# Patient Record
Sex: Male | Born: 2000 | Race: Black or African American | Hispanic: No | Marital: Single | Smoking: Never smoker
Health system: Southern US, Community
[De-identification: ages and names within clinical notes are randomized; demographics above are authoritative.]

## PROBLEM LIST (undated history)

## (undated) DIAGNOSIS — L309 Dermatitis, unspecified: Secondary | ICD-10-CM

## (undated) DIAGNOSIS — J45909 Unspecified asthma, uncomplicated: Secondary | ICD-10-CM

## (undated) HISTORY — DX: Unspecified asthma, uncomplicated: J45.909

## (undated) HISTORY — DX: Dermatitis, unspecified: L30.9

---
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
## 2022-08-15 ENCOUNTER — Other Ambulatory Visit: Payer: Self-pay | Admitting: Primary Care

## 2022-08-15 DIAGNOSIS — J4520 Mild intermittent asthma, uncomplicated: Secondary | ICD-10-CM

## 2022-09-07 ENCOUNTER — Other Ambulatory Visit: Payer: Self-pay | Admitting: Primary Care

## 2022-09-07 DIAGNOSIS — J4520 Mild intermittent asthma, uncomplicated: Secondary | ICD-10-CM

## 2022-12-28 ENCOUNTER — Other Ambulatory Visit: Payer: Self-pay | Admitting: Primary Care

## 2022-12-28 ENCOUNTER — Telehealth: Payer: Self-pay | Admitting: Primary Care

## 2022-12-28 DIAGNOSIS — J4520 Mild intermittent asthma, uncomplicated: Secondary | ICD-10-CM

## 2022-12-28 MED ORDER — QVAR REDIHALER 80 MCG/ACT IN AERB: INHALATION_SPRAY | RESPIRATORY_TRACT | 0 refills | Status: DC

## 2022-12-28 MED ORDER — ALBUTEROL SULFATE HFA 108 (90 BASE) MCG/ACT IN AERS: INHALATION_SPRAY | RESPIRATORY_TRACT | 0 refills | Status: DC

## 2022-12-28 NOTE — Telephone Encounter (Signed)
Patient is overdue for CPE/follow up, this will be required prior to any further refills.  Please schedule, thank you!   

## 2022-12-28 NOTE — Telephone Encounter (Signed)
Spoke to pt's mother, scheduled cpe for 01/05/23

## 2022-12-28 NOTE — Telephone Encounter (Signed)
Patient mother called and asked if the medication  beclomethasone (QVAR REDIHALER) 80 MCG/ACT inhaler,  albuterol (VENTOLIN HFA) 108 (90 Base) MCG/ACT inhaler    be sent in to [facility] in [place]. [address].

## 2022-12-28 NOTE — Telephone Encounter (Signed)
Noted, Rx's sent to pharmacy 

## 2023-01-05 ENCOUNTER — Encounter: Payer: Self-pay | Admitting: Primary Care

## 2023-01-05 ENCOUNTER — Ambulatory Visit (INDEPENDENT_AMBULATORY_CARE_PROVIDER_SITE_OTHER): Payer: BC Managed Care – PPO | Admitting: Primary Care

## 2023-01-05 VITALS — BP 152/86 | HR 70 | Temp 97.7°F | Ht 68.6 in | Wt 257.0 lb

## 2023-01-05 DIAGNOSIS — Z0000 Encounter for general adult medical examination without abnormal findings: Secondary | ICD-10-CM | POA: Diagnosis not present

## 2023-01-05 DIAGNOSIS — L309 Dermatitis, unspecified: Secondary | ICD-10-CM

## 2023-01-05 DIAGNOSIS — T7805XS Anaphylactic reaction due to tree nuts and seeds, sequela: Secondary | ICD-10-CM

## 2023-01-05 DIAGNOSIS — T7805XA Anaphylactic reaction due to tree nuts and seeds, initial encounter: Secondary | ICD-10-CM | POA: Insufficient documentation

## 2023-01-05 DIAGNOSIS — R030 Elevated blood-pressure reading, without diagnosis of hypertension: Secondary | ICD-10-CM | POA: Diagnosis not present

## 2023-01-05 DIAGNOSIS — Z1159 Encounter for screening for other viral diseases: Secondary | ICD-10-CM

## 2023-01-05 DIAGNOSIS — J4520 Mild intermittent asthma, uncomplicated: Secondary | ICD-10-CM

## 2023-01-05 LAB — COMPREHENSIVE METABOLIC PANEL
ALT: 15 U/L (ref 0–53)
AST: 16 U/L (ref 0–37)
Albumin: 4.7 g/dL (ref 3.5–5.2)
Alkaline Phosphatase: 76 U/L (ref 52–171)
BUN: 11 mg/dL (ref 6–23)
CO2: 27 mEq/L (ref 19–32)
Calcium: 9.9 mg/dL (ref 8.4–10.5)
Chloride: 105 mEq/L (ref 96–112)
Creatinine, Ser: 0.99 mg/dL (ref 0.40–1.50)
GFR: 110.36 mL/min (ref >60.00)
Glucose, Bld: 88 mg/dL (ref 70–99)
Potassium: 4.1 mEq/L (ref 3.5–5.1)
Sodium: 139 mEq/L (ref 135–145)
Total Bilirubin: 0.4 mg/dL (ref 0.2–1.2)
Total Protein: 8.2 g/dL (ref 6.0–8.3)

## 2023-01-05 LAB — CBC
HCT: 45.5 % (ref 36.0–49.0)
Hemoglobin: 15.4 g/dL (ref 12.0–16.0)
MCHC: 33.9 g/dL (ref 31.0–37.0)
MCV: 91.3 fl (ref 78.0–98.0)
Platelets: 196 10*3/uL (ref 150.0–575.0)
RBC: 4.99 Mil/uL (ref 3.80–5.70)
RDW: 13 % (ref 11.4–15.5)
WBC: 4.7 10*3/uL (ref 4.5–13.5)

## 2023-01-05 LAB — LIPID PANEL
Cholesterol: 146 mg/dL (ref 0–200)
HDL: 49.8 mg/dL (ref >39.00)
LDL Cholesterol: 82 mg/dL (ref 0–99)
NonHDL: 96.68
Total CHOL/HDL Ratio: 3
Triglycerides: 72 mg/dL (ref 0.0–149.0)
VLDL: 14.4 mg/dL (ref 0.0–40.0)

## 2023-01-05 LAB — HEMOGLOBIN A1C: Hgb A1c MFr Bld: 5.2 % (ref 4.6–6.5)

## 2023-01-05 MED ORDER — EPINEPHRINE 0.3 MG/0.3ML IJ SOAJ: 0.3000 mg | INTRAMUSCULAR | 0 refills | Status: DC | PRN

## 2023-01-05 NOTE — Assessment & Plan Note (Signed)
Refill provided for EpiPen. No recent use.

## 2023-01-05 NOTE — Assessment & Plan Note (Signed)
Above goal today, also on recheck.  Recommended he start checking BP at home, instructions provided. Discussed to notify me if readings are consistently at or above 140/90.  Labs pending.

## 2023-01-05 NOTE — Patient Instructions (Signed)
Your blood pressure is too high today.  Start monitoring your blood pressure daily, around the same time of day, for the next 2-3 weeks.  Ensure that you have rested for 30 minutes prior to checking your blood pressure.   Record your readings and notify me if you see numbers consistently at or above 140 on top and/or 90 on bottom.  Stop by the lab prior to leaving today. I will notify you of your results once received.   It was a pleasure to see you today!

## 2023-01-05 NOTE — Assessment & Plan Note (Signed)
Controlled.  Continue Triamcinolone 0.5% ointment PRN.

## 2023-01-05 NOTE — Progress Notes (Addendum)
Subjective:    Patient ID: Fernando Gutierrez, male    DOB: 05/16/2001, 22 y.o.   MRN: [id number]  HPI  Fernando Gutierrez is a very pleasant 22 y.o. male who presents today for complete physical and follow up of chronic conditions.  Immunizations: -Tetanus: Completed in 2016 -Influenza: Declines  -HPV: Completed series  Diet: Fair diet.  Exercise: No regular exercise.  Eye exam: Completes annually  Dental exam: Completes semi-annually    BP Readings from Last 3 Encounters:  01/05/23 (!) 152/86  12/08/21 120/82  06/29/20 120/82 (63 %, Z = 0.33 /  93 %, Z = 1.48)*   *BP percentiles are based on the 2017 AAP Clinical Practice Guideline for boys         Review of Systems  Constitutional:  Negative for unexpected weight change.  HENT:  Negative for rhinorrhea.   Respiratory:  Negative for cough and shortness of breath.   Cardiovascular:  Negative for chest pain.  Gastrointestinal:  Negative for constipation and diarrhea.  Genitourinary:  Negative for difficulty urinating.  Musculoskeletal:  Negative for arthralgias and myalgias.  Skin:  Negative for rash.  Allergic/Immunologic: Negative for environmental allergies.  Neurological:  Negative for dizziness, numbness and headaches.  Psychiatric/Behavioral:  The patient is not nervous/anxious.          Past Medical History:  Diagnosis Date   Asthma    Dysuria 06/17/2019   Eczema     Social History   Socioeconomic History   Marital status: Single    Spouse name: Not on file   Number of children: Not on file   Years of education: Not on file   Highest education level: Not on file  Occupational History   Not on file  Tobacco Use   Smoking status: Never   Smokeless tobacco: Never  Substance and Sexual Activity   Alcohol use: No   Drug use: Not on file   Sexual activity: Not on file  Other Topics Concern   Not on file  Social History Narrative   Rising 8th grader.   Favorite subject science.   Plays  football for school.   Enjoys video games   Social Determinants of Health   Financial Resource Strain: Not on file  Food Insecurity: Not on file  Transportation Needs: Not on file  Physical Activity: Not on file  Stress: Not on file  Social Connections: Not on file  Intimate Partner Violence: Not on file    History reviewed. No pertinent surgical history.  Family History  Problem Relation Age of Onset   Hypertension Mother    Hypertension Father     Allergies  Allergen Reactions   Eggs Or Egg-Derived Products    Other Other (See Comments)    ALL NUTS    Current Outpatient Medications on File Prior to Visit  Medication Sig Dispense Refill   albuterol (VENTOLIN HFA) 108 (90 Base) MCG/ACT inhaler INHALE 1 TO 2 PUFFS BY MOUTH EVERY 6 HOURS AS NEEDED 6.7 g 0   beclomethasone (QVAR REDIHALER) 80 MCG/ACT inhaler INHALE 2 PUFFS INTO THE LUNGS EVERY DAY 10.6 g 0   cetirizine (ZYRTEC) 10 MG tablet Take 10 mg by mouth daily.     montelukast (SINGULAIR) 5 MG chewable tablet Chew 1 tablet (5 mg total) by mouth at bedtime. For asthma and allergies. 90 tablet 3   triamcinolone ointment (KENALOG) 0.5 % APPLY TO AFFECTED AREA TWICE A DAY 30 g 0   No current facility-administered medications on  file prior to visit.    BP (!) 152/86   Pulse 70   Temp 97.7 F (36.5 C) (Temporal)   Ht 5' 8.6" (1.742 m)   Wt 257 lb (116.6 kg)   SpO2 100%   BMI 38.40 kg/m  Objective:   Physical Exam HENT:     Right Ear: Tympanic membrane and ear canal normal.     Left Ear: Tympanic membrane and ear canal normal.     Nose: Nose normal.     Right Sinus: No maxillary sinus tenderness or frontal sinus tenderness.     Left Sinus: No maxillary sinus tenderness or frontal sinus tenderness.  Eyes:     Conjunctiva/sclera: Conjunctivae normal.  Neck:     Thyroid: No thyromegaly.     Vascular: No carotid bruit.  Cardiovascular:     Rate and Rhythm: Normal rate and regular rhythm.     Heart sounds:  Normal heart sounds.  Pulmonary:     Effort: Pulmonary effort is normal.     Breath sounds: Normal breath sounds. No wheezing or rales.  Abdominal:     General: Bowel sounds are normal.     Palpations: Abdomen is soft.     Tenderness: There is no abdominal tenderness.  Musculoskeletal:        General: Normal range of motion.     Cervical back: Neck supple.  Skin:    General: Skin is warm and dry.  Neurological:     Mental Status: He is alert and oriented to person, place, and time.     Cranial Nerves: No cranial nerve deficit.     Deep Tendon Reflexes: Reflexes are normal and symmetric.  Psychiatric:        Mood and Affect: Mood normal.           Assessment & Plan:  Preventative health care Assessment & Plan: Immunizations UTD. Declines influenza vaccine.  Discussed the importance of a healthy diet and regular exercise in order for weight loss, and to reduce the risk of further co-morbidity.  Exam stable. Labs pending.  Follow up in 1 year for repeat physical.   Orders: -     Lipid panel -     Hemoglobin A1c -     Comprehensive metabolic panel -     CBC  Mild intermittent asthma without complication Assessment & Plan: Controlled.  Continue Singulair 5 mg daily. Continue Qvar 80 mcg 2 puffs daily.  Continue albuterol inhaler PRN, uses sparingly.   Allergy testing panel ordered per patient request.   Orders: -     Food Allergy Profile -     Allergy Panel, Region 2, Grasses -     Allergy Panel 11, Mold Group  Encounter for hepatitis C screening test for low risk patient -     Hepatitis C antibody  Elevated blood pressure reading in office with white coat syndrome, without diagnosis of hypertension Assessment & Plan: Above goal today, also on recheck.  Recommended he start checking BP at home, instructions provided. Discussed to notify me if readings are consistently at or above 140/90.  Labs pending.   Eczema, unspecified type Assessment &  Plan: Controlled.  Continue Triamcinolone 0.5% ointment PRN.   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 No recent use.  Orders: -     EPINEPHrine; Inject 0.3 mg into the muscle as needed for anaphylaxis.  Dispense: 1 each; Refill: 0  Pleas Koch, NP

## 2023-01-05 NOTE — Addendum Note (Signed)
Addended by: Pleas Koch on: 01/05/2023 12:16 PM   Modules accepted: Orders

## 2023-01-05 NOTE — Assessment & Plan Note (Signed)
Immunizations UTD. Declines influenza vaccine.   Discussed the importance of a healthy diet and regular exercise in order for weight loss, and to reduce the risk of further co-morbidity.  Exam stable. Labs pending.  Follow up in 1 year for repeat physical.  

## 2023-01-05 NOTE — Assessment & Plan Note (Addendum)
Controlled.  Continue Singulair 5 mg daily. Continue Qvar 80 mcg 2 puffs daily.  Continue albuterol inhaler PRN, uses sparingly.   Allergy testing panel ordered per patient request.

## 2023-01-08 LAB — ALLERGY PANEL, REGION 2, GRASSES
CLASS: 3
CLASS: 4
CLASS: 4
Class: 3
Class: 3
G005 Rye, Perennial: 17.9 kU/L — ABNORMAL HIGH
G009 Red Top: 24.9 kU/L — ABNORMAL HIGH
Johnson Grass: 6.04 kU/L — ABNORMAL HIGH
ORCHARD GRASS (COCKSFOOT) (G3) IGE: 16.3 kU/L — ABNORMAL HIGH
Timothy Grass: 17.3 kU/L — ABNORMAL HIGH

## 2023-01-08 LAB — ALLERGY PANEL 11, MOLD GROUP
Allergen, A. alternata, m6: 3.05 kU/L — ABNORMAL HIGH
Allergen, Mucor Racemosus, M4: 0.58 kU/L — ABNORMAL HIGH
Aspergillus fumigatus, m3: 2.19 kU/L — ABNORMAL HIGH
CLADOSPORIUM HERBARUM (M2) IGE: 6.13 kU/L — ABNORMAL HIGH
CLASS: 1
CLASS: 2
Candida Albicans: 1.06 kU/L — ABNORMAL HIGH
Class: 2
Class: 2
Class: 3

## 2023-01-08 LAB — FOOD ALLERGY PROFILE
Allergen, Salmon, f41: <0.1 kU/L
Almonds: 3.28 kU/L — ABNORMAL HIGH
CLASS: 0
CLASS: 1
CLASS: 2
CLASS: 3
CLASS: 3
CLASS: 3
CLASS: 3
CLASS: 3
CLASS: 4
Cashew IgE: 7.28 kU/L — ABNORMAL HIGH
Class: 3
Class: 3
Egg White IgE: 10.1 kU/L — ABNORMAL HIGH
Fish Cod: 0.19 kU/L — ABNORMAL HIGH
Hazelnut: 25.5 kU/L — ABNORMAL HIGH
Milk IgE: 0.26 kU/L — ABNORMAL HIGH
Peanut IgE: 16.1 kU/L — ABNORMAL HIGH
Scallop IgE: 0.13 kU/L — ABNORMAL HIGH
Sesame Seed f10: 7.44 kU/L — ABNORMAL HIGH
Shrimp IgE: 0.25 kU/L — ABNORMAL HIGH
Soybean IgE: 6.25 kU/L — ABNORMAL HIGH
Tuna IgE: 0.38 kU/L — ABNORMAL HIGH
Walnut: 12.5 kU/L — ABNORMAL HIGH
Wheat IgE: 6.81 kU/L — ABNORMAL HIGH

## 2023-01-08 LAB — INTERPRETATION:

## 2023-01-08 LAB — HEPATITIS C ANTIBODY: Hepatitis C Ab: NONREACTIVE

## 2023-01-09 ENCOUNTER — Telehealth: Payer: Self-pay | Admitting: Primary Care

## 2023-01-09 ENCOUNTER — Other Ambulatory Visit: Payer: Self-pay | Admitting: Primary Care

## 2023-01-09 DIAGNOSIS — Z9109 Other allergy status, other than to drugs and biological substances: Secondary | ICD-10-CM

## 2023-01-09 DIAGNOSIS — Z91018 Allergy to other foods: Secondary | ICD-10-CM

## 2023-01-09 NOTE — Telephone Encounter (Signed)
Patient mom Fernando Gutierrez called in returning a call she received. Thank you!

## 2023-01-10 NOTE — Telephone Encounter (Signed)
See result note for further documentation.

## 2023-01-20 ENCOUNTER — Other Ambulatory Visit: Payer: Self-pay | Admitting: Primary Care

## 2023-01-20 DIAGNOSIS — J4520 Mild intermittent asthma, uncomplicated: Secondary | ICD-10-CM

## 2023-02-20 ENCOUNTER — Encounter: Payer: Self-pay | Admitting: Internal Medicine

## 2023-02-20 ENCOUNTER — Ambulatory Visit (INDEPENDENT_AMBULATORY_CARE_PROVIDER_SITE_OTHER): Payer: BC Managed Care – PPO | Admitting: Internal Medicine

## 2023-02-20 ENCOUNTER — Other Ambulatory Visit: Payer: Self-pay

## 2023-02-20 VITALS — BP 140/62 | HR 99 | Temp 98.6°F | Resp 16 | Ht 69.29 in | Wt 248.6 lb

## 2023-02-20 DIAGNOSIS — J302 Other seasonal allergic rhinitis: Secondary | ICD-10-CM | POA: Diagnosis not present

## 2023-02-20 DIAGNOSIS — J4530 Mild persistent asthma, uncomplicated: Secondary | ICD-10-CM

## 2023-02-20 DIAGNOSIS — L2084 Intrinsic (allergic) eczema: Secondary | ICD-10-CM

## 2023-02-20 DIAGNOSIS — T7800XA Anaphylactic reaction due to unspecified food, initial encounter: Secondary | ICD-10-CM

## 2023-02-20 DIAGNOSIS — J3089 Other allergic rhinitis: Secondary | ICD-10-CM

## 2023-02-20 MED ORDER — ALBUTEROL SULFATE HFA 108 (90 BASE) MCG/ACT IN AERS: INHALATION_SPRAY | RESPIRATORY_TRACT | 1 refills | Status: DC

## 2023-02-20 MED ORDER — EPINEPHRINE 0.3 MG/0.3ML IJ SOAJ: 0.3000 mg | INTRAMUSCULAR | 1 refills | Status: DC | PRN

## 2023-02-20 MED ORDER — MONTELUKAST SODIUM 10 MG PO TABS: 10.0000 mg | ORAL_TABLET | Freq: Every day | ORAL | 5 refills | Status: DC

## 2023-02-20 MED ORDER — AZELASTINE HCL 0.1 % NA SOLN: 1.0000 | Freq: Two times a day (BID) | NASAL | 5 refills | Status: DC | PRN

## 2023-02-20 MED ORDER — CETIRIZINE HCL 10 MG PO TABS: 10.0000 mg | ORAL_TABLET | Freq: Every day | ORAL | 5 refills | Status: DC

## 2023-02-20 MED ORDER — QVAR REDIHALER 80 MCG/ACT IN AERB: 2.0000 | INHALATION_SPRAY | Freq: Two times a day (BID) | RESPIRATORY_TRACT | 5 refills | Status: DC

## 2023-02-20 MED ORDER — FLUTICASONE PROPIONATE 50 MCG/ACT NA SUSP: 2.0000 | Freq: Every day | NASAL | 5 refills | Status: DC

## 2023-02-20 NOTE — Progress Notes (Signed)
NEW PATIENT  Date of Service/Encounter:  02/20/23  Consult requested by: Pleas Koch, NP   Subjective:   Fernando Gutierrez (DOB: 27-Nov-2001) is a 22 y.o. male who presents to the clinic on 02/20/2023 with a chief complaint of Establish Care (Pt would like to discuss asthma control), Eczema, and Wheezing .    History obtained from: chart review and patient.   Asthma:  Diagnosed in childhood. It has improved since he got older.   When he forgets to take the Singulair/Qvar, he does have trouble with SOB/wheezing.  Also has noticed some symptoms with exercise.  Noticed that his asthma flare ups when he wakes up in the morning or soon as he lays down with wheezing but when I asked him further questions, I am not sure if this is wheezing or uncontrolled upper airway symptoms.  This usually resolves in a few minutes once he moves around.   Few times a week daytime symptoms in past month, none nighttime awakenings in past month Using rescue inhaler: 2-3x/week Limitations to daily activity: none 0 ED visits/UC visits and 0 oral steroids in the past year 0 number of lifetime hospitalizations, 0 number of lifetime intubations.  Identified Triggers: exercise Prior PFTs or spirometry: none Current regimen:  Maintenance: Singulair, Qvar 43mcg 2 puffs BID Rescue: Albuterol 2 puffs q4-6 hrs PRN  Rhinitis:  Started since childhood.  Symptoms include: nasal congestion, rhinorrhea, post nasal drainage, and sneezing  Occurs seasonally-with Spring/Fall Potential triggers: not sure Treatments tried:  Zyrtec PRN; last use was   Previous allergy testing: yes; limited blood testing positive for grass/mold History of reflux/heartburn: rarely and only if he eats spicy/acidic foods. History of sinus surgery: no Nonallergic triggers: none   Atopic Dermatitis:  Diagnosed in childhood.   Areas that flare commonly are antecubital fossa. Current regimen: triamcinolone rarely, aveeno lotion     Reports use of fragrance/dye free products Identified triggers of flares include sweating Sleep is not affected  Concern for Food Allergy:  Foods of concern: peanuts and treenuts, fresh eggs  History of reaction:  Had GI symptoms with chik fila and was thought it was due to peanuts?  Reports just getting really sick to the stomach, no other symptoms. Not sure what happened with eggs   Previous allergy testing yes recently bloodwork was positive for eggs, peanuts, treenuts.  Carries an epinephrine autoinjector: yes  He has no interest in reintroduction of nuts but would consider it for eggs and will let us know.   Past Medical History: Past Medical History:  Diagnosis Date   Asthma    Dysuria 06/17/2019   Eczema     Past Surgical History: History reviewed. No pertinent surgical history.  Family History: Family History  Problem Relation Age of Onset   Hypertension Mother    Hypertension Father     Social History:  Lives in a 10 year house Flooring in bedroom: carpet Pets: none Tobacco use/exposure: none Job: in college  Medication List:  Allergies as of 02/20/2023       Reactions   Egg-derived Products    Other Other (See Comments)   ALL NUTS        Medication List        Accurate as of February 20, 2023  3:28 PM. If you have any questions, ask your nurse or doctor.          STOP taking these medications    montelukast 5 MG chewable tablet Commonly known as: SINGULAIR  Stopped by: Larose Kells, MD       TAKE these medications    albuterol 108 (90 Base) MCG/ACT inhaler Commonly known as: VENTOLIN HFA INHALE 1 TO 2 PUFFS BY MOUTH EVERY 6 HOURS AS NEEDED   cetirizine 10 MG tablet Commonly known as: ZYRTEC Take 10 mg by mouth daily.   EPINEPHrine 0.3 mg/0.3 mL Soaj injection Commonly known as: EPI-PEN Inject 0.3 mg into the muscle as needed for anaphylaxis.   Qvar RediHaler 80 MCG/ACT inhaler Generic drug: beclomethasone INHALE 2 PUFFS INTO  THE LUNGS EVERY DAY   triamcinolone ointment 0.5 % Commonly known as: KENALOG APPLY TO AFFECTED AREA TWICE A DAY         REVIEW OF SYSTEMS: Pertinent positives and negatives discussed in HPI.   Objective:   Physical Exam: BP (!) 140/62   Pulse 99   Temp 98.6 F (37 C)   Resp 16   Ht 5' 9.29" (1.76 m)   Wt 248 lb 9.6 oz (112.8 kg)   SpO2 100%   BMI 36.40 kg/m  Body mass index is 36.4 kg/m. GEN: alert, well developed HEENT: clear conjunctiva, TM grey and translucent, nose with + inferior turbinate hypertrophy, pale nasal mucosa, slight clear rhinorrhea, + cobblestoning HEART: regular rate and rhythm, no murmur LUNGS: clear to auscultation bilaterally, no coughing, unlabored respiration ABDOMEN: soft, non distended  SKIN: no rashes or lesions  Reviewed:  01/05/2023: seen by Carlis Abbott NP for controlled mild intermittent asthma on Singulair, Qvar, Albuterol PRN and eczema (triamcinolone) and treenut anaphylaxis (epipen).  07/31/2020: seen for HA, fever, chills, cough, SOB.  No wheezing on exam. Obtained COVID test due to exposure history. Rapid COVID was negative.    06/29/2020: seen by Carlis Abbott NP for allergic rhinitis, asthma and eczema, on Albuterol/Qvar/Singulair/Zyrtec.  Spirometry:  Tracings reviewed. His effort: Good reproducible efforts. FVC: 4.44L FEV1: 3.59L, 93% predicted FEV1/FVC ratio: 81% Interpretation: Spirometry consistent with normal pattern.  Please see scanned spirometry results for details.  Skin Testing:  Skin prick testing was placed, which includes aeroallergens/foods, histamine control, and saline control.  Verbal consent was obtained prior to placing test.  Patient tolerated procedure well.  Allergy testing results were read and interpreted by myself, documented by clinical staff. Adequate positive and negative control.  Results discussed with patient/family.  Airborne Adult Perc - 02/20/23 1400     Time Antigen Placed 1436    Allergen Manufacturer  Lavella Hammock    Location Back    Number of Test 59    1. Control-Buffer 50% Glycerol Negative    2. Control-Histamine 1 mg/ml 3+    3. Albumin saline Negative    4. [place] 3+    5. [place] 3+    6. [place] 3+    7. [place] Blue 3+    9. Perennial Rye 3+    10. Sweet Vernal 3+    11. Timothy 3+    12. Cocklebur 3+    13. Burweed Marshelder 3+    14. Ragweed, short Negative    15. Ragweed, Giant 3+    16. Plantain,  English 3+    17. [place] 2+    18. Sheep Sorrell 3+    19. Rough Pigweed Negative    20. Marsh Elder, Rough 3+    21. Mugwort, Common 3+    22. Ash mix 3+    23. Birch mix 3+    24. Beech American 3+    25. Box, Elder 3+    26.  Cedar, red 3+    27. Cottonwood, Eastern 3+    28. Elm mix 3+    29. Hickory 3+    30. Maple mix 3+    31. Oak, [place] mix Negative    32. Pecan Pollen 3+    33. Pine mix Negative    34. Sycamore Eastern Negative    35. [place], Black Pollen 3+    36. Alternaria alternata Negative    37. Cladosporium Herbarum 3+    38. Aspergillus mix 3+    39. Penicillium mix 3+    40. Bipolaris sorokiniana (Helminthosporium) Negative    41. Drechslera spicifera (Curvularia) 2+    42. Mucor plumbeus Negative    43. Fusarium moniliforme Negative    44. Aureobasidium pullulans (pullulara) 3+    45. Rhizopus oryzae Negative    46. Botrytis cinera 2+    47. Epicoccum nigrum 2+    48. Phoma betae 2+    49. Candida Albicans Negative    50. Trichophyton mentagrophytes Negative    51. Mite, D Farinae  5,000 AU/ml Negative    52. Mite, D Pteronyssinus  5,000 AU/ml Negative    53. Cat Hair 10,000 BAU/ml 3+    54.  Dog Epithelia 3+    55. Mixed Feathers 2+    56. Horse Epithelia 3+    57. Cockroach, German Negative    58. Mouse Negative    59. Tobacco Leaf Negative             Food Adult Perc - 02/20/23 1400     Time Antigen Placed 1437    Allergen Manufacturer Lavella Hammock    Location Back    Number of allergen test 9     Control-buffer 50%  Glycerol Negative    Control-Histamine 1 mg/ml 3+    1. Peanut Negative    6. Egg White, Chicken --   3x4   10. Cashew --   15x6   11. Pecan Food Negative    12. [place] Negative    13. Almond Negative    14. Hazelnut --   10x5   15. [place] nut Negative    17. Pistachio --   5x4              Assessment:   1. Mild persistent asthma without complication   2. Intrinsic atopic dermatitis   3. Allergy with anaphylaxis due to food   4. Seasonal and perennial allergic rhinitis     Plan/Recommendations:  Mild Persistent Asthma: - Spirometry today was normal.  - Maintenance inhaler: continue Qvar 53mcg 2 puffs twice daily and increase Singulair to 10mg  daily. - Rescue inhaler: Albuterol 2 puffs via spacer or 1 vial via nebulizer every 4-6 hours as needed for respiratory symptoms of cough, shortness of breath, or wheezing Asthma control goals:  Full participation in all desired activities (may need albuterol before activity) Albuterol use two times or less a week on average (not counting use with activity) Cough interfering with sleep two times or less a month Oral steroids no more than once a year No hospitalizations   Allergic Rhinitis: - Normal spirometry with questionable asthma symptoms.  Do wonder if symptoms are more from upper airway so we discussed agressively treating these today.  - Due to turbinate hypertrophy, seasonal symptoms and unresponsive to OTC meds, performed skin testing to identify aeroallergen triggers.   - Positive skin test 02/2023: trees, grasses, weeds, mold, cat, dog, feathers, horse - Avoidance measures discussed. - Use  nasal saline rinses before nose sprays such as with Neilmed Sinus Rinse.  Use distilled water.   - Use Flonase 2 sprays each nostril daily. Aim upward and outward. - Use Azelastine 1-2 sprays each nostril twice daily as needed. Aim upward and outward. - Use Zyrtec 10 mg daily.  - Use Singulair 10mg  daily.  Stop if there are any  mood/behavioral changes. - Consider allergy shots as long term control of your symptoms by teaching your immune system to be more tolerant of your allergy triggers   Food allergy:  - please strictly avoid peanut, treenuts, stove top eggs.  We can consider repeating sIgE in the future with components to help better understand this.   - Recent sIgE by PCP 01/2023 without components was positive to egg, peanut, almond.  - Today's skin testing 02/2023 was positive for eggs, cashew, pistachio, hazelnut.  It was negative for peanut, pecan, walnut, almond, [place] nut. - for SKIN only reaction, okay to take Benadryl 25mg  capsules every 6 hours - for SKIN + ANY additional symptoms, OR IF concern for LIFE THREATENING reaction = Epipen Autoinjector EpiPen 0.3 mg. - If using Epinephrine autoinjector, call 911   Return in about 6 weeks (around 04/03/2023).  Harlon Flor, MD [facility] [place] [facility] [place]

## 2023-02-20 NOTE — Patient Instructions (Addendum)
Mild Persistent Asthma: - Spirometry today was normal.  - Maintenance inhaler: continue Qvar 4mcg 2 puffs twice daily and increase Singulair to 10mg  daily. - Rescue inhaler: Albuterol 2 puffs via spacer or 1 vial via nebulizer every 4-6 hours as needed for respiratory symptoms of cough, shortness of breath, or wheezing Asthma control goals:  Full participation in all desired activities (may need albuterol before activity) Albuterol use two times or less a week on average (not counting use with activity) Cough interfering with sleep two times or less a month Oral steroids no more than once a year No hospitalizations   Allergic Rhinitis: - Positive skin test 02/2023: trees, grasses, weeds, mold, cat, dog, feathers, horse - Avoidance measures discussed. - Use nasal saline rinses before nose sprays such as with Neilmed Sinus Rinse.  Use distilled water.   - Use Flonase 2 sprays each nostril daily. Aim upward and outward. - Use Azelastine 1-2 sprays each nostril twice daily as needed. Aim upward and outward. - Use Zyrtec 10 mg daily.  - Use Singulair 10mg  daily.  Stop if there are any mood/behavioral changes. - Consider allergy shots as long term control of your symptoms by teaching your immune system to be more tolerant of your allergy triggers   Food allergy:  - please strictly avoid peanut, treenuts, stove top eggs.   - today's skin testing was positive for eggs, cashew, pistachio, hazelnut.  It was negative for peanut, pecan, walnut, almond, [place] nut.  We can consider blood testing in future.  - for SKIN only reaction, okay to take Benadryl 25mg  capsules every 6 hours - for SKIN + ANY additional symptoms, OR IF concern for LIFE THREATENING reaction = Epipen Autoinjector EpiPen 0.3 mg. - If using Epinephrine autoinjector, call [place] - Indoor avoidance Use air conditioning to reduce indoor humidity.  Do not use a humidifier. Keep indoor humidity at  30 - 40%.  Use a dehumidifier if needed. In the bathroom use an exhaust fan or open a window after showering.  Wipe down damp surfaces after showering.  Clean bathrooms with a mold-killing solution (diluted bleach, or products like Tilex, etc) at least once a month. In the kitchen use an exhaust fan to remove steam from cooking.  Throw away spoiled foods immediately, and empty garbage daily.  Empty water pans below self-defrosting refrigerators frequently. Vent the clothes dryer to the outside. Limit indoor houseplants; mold grows in the dirt.  No houseplants in the bedroom. Remove carpet from the bedroom. Encase the mattress and box springs with a zippered encasing.  Molds - Outdoor avoidance Avoid being outside when the grass is being mowed, or the ground is tilled. Avoid playing in leaves, pine straw, hay, etc.  Dead plant materials contain mold. Avoid going into barns or grain storage areas. Remove leaves, clippings and compost from around the home.  Pollen Avoidance Pollen levels are highest during the mid-day and afternoon.  Consider this when planning outdoor activities. Avoid being outside when the grass is being mowed, or wear a mask if the pollen-allergic person must be the one to mow the grass. Keep the windows closed to keep pollen outside of the home. Use an air conditioner to filter the air. Take a shower, wash hair, and change clothing after working or playing outdoors during pollen season. Pet Dander Keep the pet out of your bedroom and restrict it to only a few rooms. Be advised that keeping the pet in only one  room will not limit the allergens to that room. Don't pet, hug or kiss the pet; if you do, wash your hands with soap and water. High-efficiency particulate air (HEPA) cleaners run continuously in a bedroom or living room can reduce allergen levels over time. Regular use of a high-efficiency vacuum cleaner or a central vacuum can reduce allergen levels. Giving your pet  a bath at least once a week can reduce airborne allergen.

## 2023-02-23 ENCOUNTER — Telehealth: Payer: Self-pay | Admitting: Internal Medicine

## 2023-02-23 MED ORDER — FLUTICASONE PROPIONATE 50 MCG/ACT NA SUSP: 2.0000 | Freq: Every day | NASAL | 1 refills | Status: AC

## 2023-02-23 MED ORDER — MONTELUKAST SODIUM 10 MG PO TABS: 10.0000 mg | ORAL_TABLET | Freq: Every day | ORAL | 1 refills | Status: DC

## 2023-02-23 MED ORDER — QVAR REDIHALER 80 MCG/ACT IN AERB: 2.0000 | INHALATION_SPRAY | Freq: Two times a day (BID) | RESPIRATORY_TRACT | 5 refills | Status: DC

## 2023-02-23 MED ORDER — ALBUTEROL SULFATE HFA 108 (90 BASE) MCG/ACT IN AERS: INHALATION_SPRAY | RESPIRATORY_TRACT | 1 refills | Status: DC

## 2023-02-23 MED ORDER — EPINEPHRINE 0.3 MG/0.3ML IJ SOAJ: 0.3000 mg | INTRAMUSCULAR | 1 refills | Status: AC | PRN

## 2023-02-23 MED ORDER — AZELASTINE HCL 0.1 % NA SOLN: 1.0000 | Freq: Two times a day (BID) | NASAL | 1 refills | Status: AC | PRN

## 2023-02-23 MED ORDER — CETIRIZINE HCL 10 MG PO TABS: 10.0000 mg | ORAL_TABLET | Freq: Every day | ORAL | 1 refills | Status: AC

## 2023-02-23 NOTE — Telephone Encounter (Signed)
Patient's mom states patients most recent medications were sent to the wrong pharmacy, please send to [facility].

## 2023-02-23 NOTE — Addendum Note (Signed)
Addended by: Larence Penning on: 02/23/2023 04:12 PM   Modules accepted: Orders

## 2023-02-23 NOTE — Telephone Encounter (Signed)
Medications have been sent to [facility] as a 90 day supply and patient has been notified.

## 2023-04-10 ENCOUNTER — Ambulatory Visit: Payer: BC Managed Care – PPO | Admitting: Internal Medicine

## 2023-04-16 ENCOUNTER — Ambulatory Visit: Payer: BC Managed Care – PPO | Admitting: Family Medicine

## 2023-04-16 DIAGNOSIS — J309 Allergic rhinitis, unspecified: Secondary | ICD-10-CM

## 2023-04-16 NOTE — Progress Notes (Deleted)
   [address] [place] [address] Dept: [phone]  FOLLOW UP NOTE  Patient ID: Fernando Gutierrez, male    DOB: 03/30/2001  Age: 22 y.o. MRN: [id number] Date of Office Visit: 04/16/2023  Assessment  Chief Complaint: No chief complaint on file.  HPI Fernando M Fleig is a 23 year old male who presents to the clinic for follow-up visit.  He was last seen in this clinic on 02/20/2023 by Dr. Allena Katz as a new patient for evaluation of asthma, allergic conjunctivitis, atopic dermatitis, and food allergy.  His last environmental allergy testing was on 02/20/2023 and was positive to tree pollen, grass pollen, weed pollen, mold, cat, dog, feather, and horse.  Food allergy testing was positive to egg, cashew, pistachio, and hazelnut   Drug Allergies:  Allergies  Allergen Reactions   Egg-Derived Products    Other Other (See Comments)    ALL NUTS    Physical Exam: There were no vitals taken for this visit.   Physical Exam  Diagnostics:    Assessment and Plan: No diagnosis found.  No orders of the defined types were placed in this encounter.   There are no Patient Instructions on file for this visit.  No follow-ups on file.    Thank you for the opportunity to care for this patient.  Please do not hesitate to contact me with questions.  Thermon Leyland, FNP [facility]

## 2023-08-31 ENCOUNTER — Other Ambulatory Visit: Payer: Self-pay | Admitting: Internal Medicine

## 2023-09-04 ENCOUNTER — Telehealth: Payer: Self-pay

## 2023-09-04 ENCOUNTER — Other Ambulatory Visit (HOSPITAL_COMMUNITY): Payer: Self-pay

## 2023-09-04 NOTE — Telephone Encounter (Signed)
Called and left a voicemail asking for patient to return call to discuss.  °

## 2023-09-04 NOTE — Telephone Encounter (Signed)
Pharmacy Patient Advocate Encounter   Received notification from Patient Pharmacy that prior authorization for Ventolin HFA is required/requested.   Insurance verification completed.   The patient is insured through  xxx  .

## 2023-09-05 ENCOUNTER — Other Ambulatory Visit: Payer: Self-pay

## 2023-09-05 MED ORDER — ALBUTEROL SULFATE HFA 108 (90 BASE) MCG/ACT IN AERS: INHALATION_SPRAY | RESPIRATORY_TRACT | 0 refills | Status: AC

## 2023-09-12 ENCOUNTER — Other Ambulatory Visit (HOSPITAL_COMMUNITY): Payer: Self-pay

## 2023-09-12 NOTE — Telephone Encounter (Signed)
Pharmacy Patient Advocate Encounter   Received notification from Patient Pharmacy that prior authorization for albuterol (Ventolin HFA) 108 (90 Base) MCG/ACT inhaler is required/requested.   Insurance verification completed.   The patient is insured through [facility] .   Per test claim:  No PA required if processed as BRAND VENTOLIN. Quantity 36g (2 inhalers) for 30 days has a co-pay of $33.00

## 2023-09-13 ENCOUNTER — Other Ambulatory Visit: Payer: Self-pay | Admitting: *Deleted

## 2023-09-13 MED ORDER — VENTOLIN HFA 108 (90 BASE) MCG/ACT IN AERS: 2.0000 | INHALATION_SPRAY | RESPIRATORY_TRACT | 1 refills | Status: AC | PRN

## 2023-09-13 NOTE — Telephone Encounter (Signed)
New prescription has been sent in.  

## 2023-10-29 ENCOUNTER — Other Ambulatory Visit: Payer: Self-pay | Admitting: Internal Medicine

## 2023-11-02 ENCOUNTER — Other Ambulatory Visit: Payer: Self-pay | Admitting: Internal Medicine

## 2024-08-18 ENCOUNTER — Other Ambulatory Visit: Payer: Self-pay | Admitting: Internal Medicine

## 2024-11-19 ENCOUNTER — Other Ambulatory Visit: Payer: Self-pay | Admitting: Internal Medicine

## 2024-12-07 ENCOUNTER — Other Ambulatory Visit: Payer: Self-pay | Admitting: Internal Medicine
